# Patient Record
Sex: Male | Born: 1955 | Race: Black or African American | Hispanic: No | Marital: Single | Smoking: Current every day smoker
Health system: Southern US, Community
[De-identification: ages and names within clinical notes are randomized; demographics above are authoritative.]

## PROBLEM LIST (undated history)

## (undated) DIAGNOSIS — G8929 Other chronic pain: Secondary | ICD-10-CM

## (undated) HISTORY — PX: TOE AMPUTATION: SHX809

## (undated) HISTORY — PX: LEG AMPUTATION BELOW KNEE: SHX694

---

## 2003-01-05 ENCOUNTER — Emergency Department (HOSPITAL_COMMUNITY): Admission: EM | Admit: 2003-01-05 | Discharge: 2003-01-05 | Payer: Self-pay | Admitting: Emergency Medicine

## 2003-08-06 ENCOUNTER — Emergency Department (HOSPITAL_COMMUNITY): Admission: AD | Admit: 2003-08-06 | Discharge: 2003-08-06 | Payer: Self-pay | Admitting: Family Medicine

## 2003-08-10 ENCOUNTER — Emergency Department (HOSPITAL_COMMUNITY): Admission: AD | Admit: 2003-08-10 | Discharge: 2003-08-10 | Payer: Self-pay | Admitting: Family Medicine

## 2003-10-10 ENCOUNTER — Emergency Department (HOSPITAL_COMMUNITY): Admission: AD | Admit: 2003-10-10 | Discharge: 2003-10-10 | Payer: Self-pay | Admitting: Family Medicine

## 2004-06-04 ENCOUNTER — Emergency Department (HOSPITAL_COMMUNITY): Admission: EM | Admit: 2004-06-04 | Discharge: 2004-06-04 | Payer: Self-pay | Admitting: Family Medicine

## 2004-06-11 ENCOUNTER — Emergency Department (HOSPITAL_COMMUNITY): Admission: EM | Admit: 2004-06-11 | Discharge: 2004-06-11 | Payer: Self-pay | Admitting: Family Medicine

## 2005-01-04 IMAGING — CR DG CHEST 2V
1 series · 1 of 1 positions shown · non-contrast
Comparison: none

CLINICAL DATA: Cough. 
 PA AND LATERAL CHEST  
 No comparison.  The cardiomediastinal contours are normal.  There is mild interstitial prominence which appears chronic.  Pulmonary vascularity is normal, and there is no confluent air space opacity or pleural effusion.  The osseous structures appear unremarkable.
 IMPRESSION
 Probable chronic mild interstitial lung disease.  No acute chest findings demonstrated.

[view not recorded]
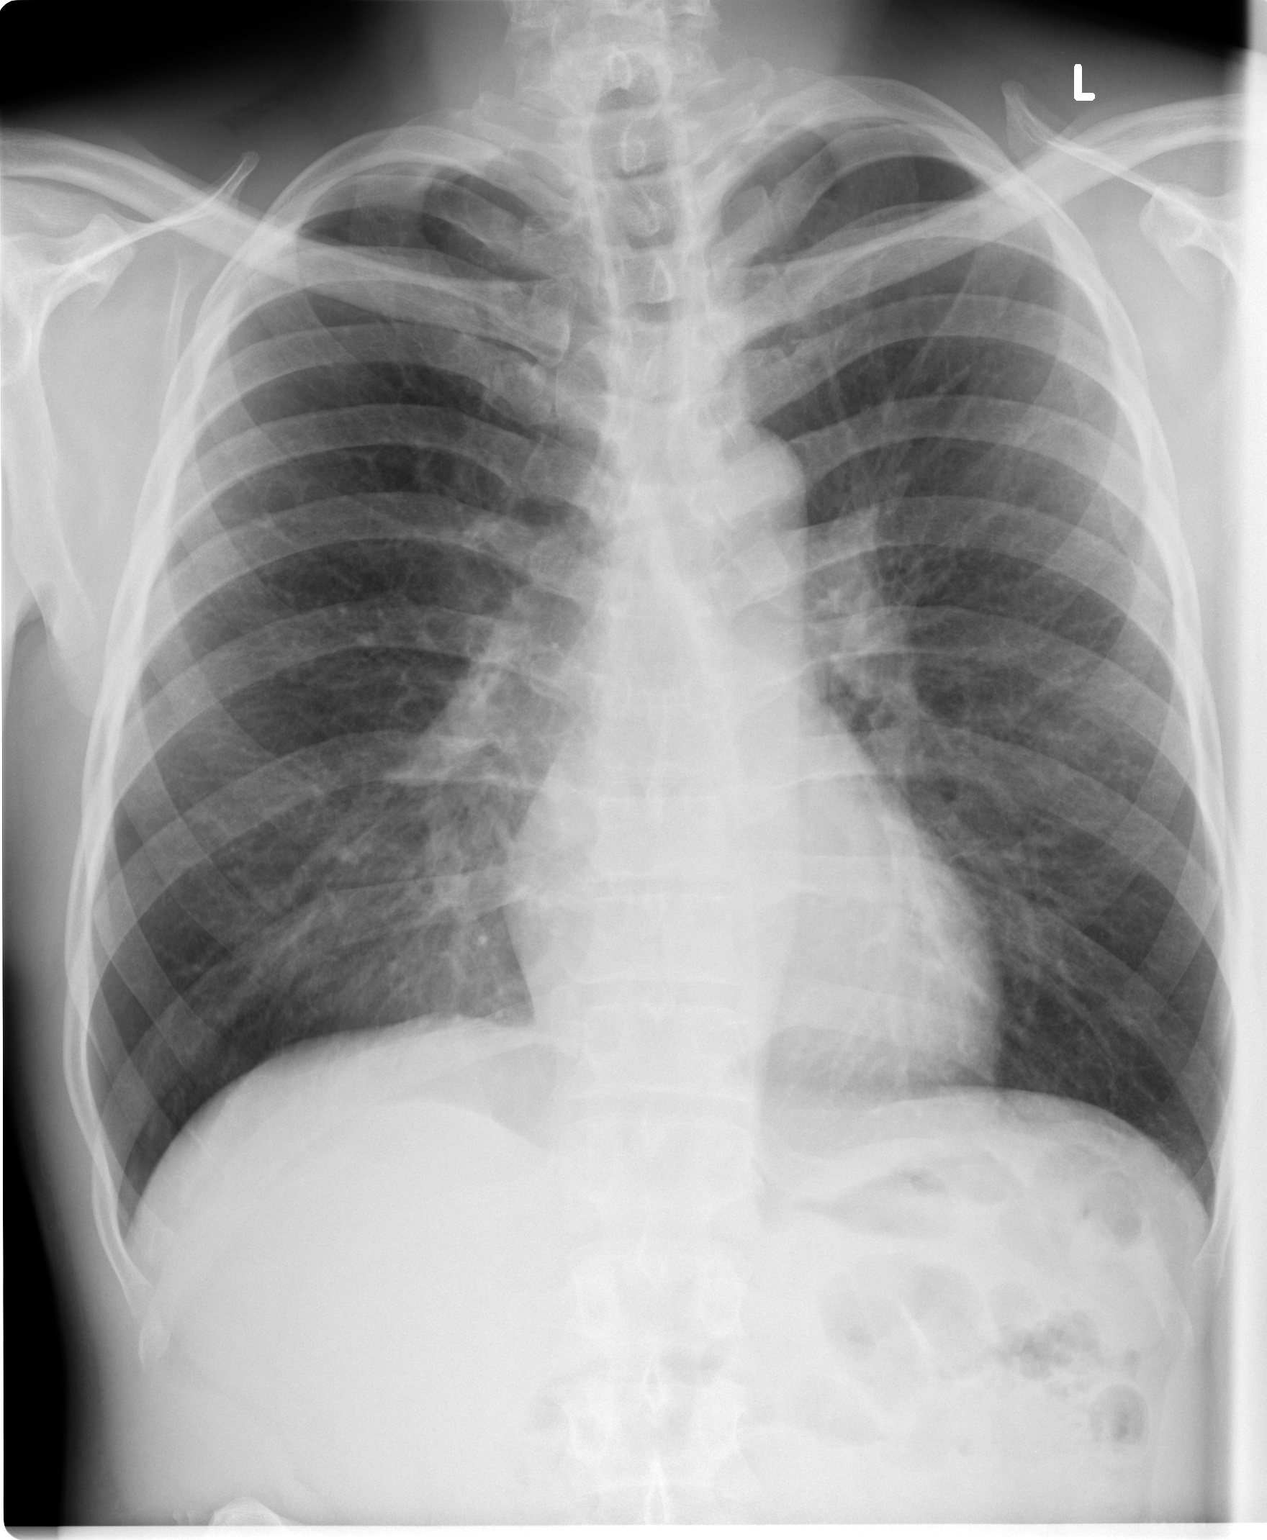

[1 of 1 positions shown; findings below may reference images not displayed]

## 2006-03-11 ENCOUNTER — Emergency Department (HOSPITAL_COMMUNITY): Admission: EM | Admit: 2006-03-11 | Discharge: 2006-03-11 | Payer: Self-pay | Admitting: Family Medicine

## 2007-04-18 ENCOUNTER — Emergency Department (HOSPITAL_COMMUNITY): Admission: EM | Admit: 2007-04-18 | Discharge: 2007-04-18 | Payer: Self-pay | Admitting: Emergency Medicine

## 2007-11-05 ENCOUNTER — Emergency Department (HOSPITAL_COMMUNITY): Admission: EM | Admit: 2007-11-05 | Discharge: 2007-11-05 | Payer: Self-pay | Admitting: Emergency Medicine

## 2007-11-07 ENCOUNTER — Emergency Department (HOSPITAL_COMMUNITY): Admission: EM | Admit: 2007-11-07 | Discharge: 2007-11-07 | Payer: Self-pay | Admitting: Emergency Medicine

## 2007-11-20 ENCOUNTER — Emergency Department (HOSPITAL_COMMUNITY): Admission: EM | Admit: 2007-11-20 | Discharge: 2007-11-20 | Payer: Self-pay | Admitting: Emergency Medicine

## 2008-06-17 ENCOUNTER — Emergency Department (HOSPITAL_COMMUNITY): Admission: EM | Admit: 2008-06-17 | Discharge: 2008-06-17 | Payer: Self-pay | Admitting: Family Medicine

## 2009-03-04 ENCOUNTER — Emergency Department (HOSPITAL_COMMUNITY): Admission: EM | Admit: 2009-03-04 | Discharge: 2009-03-04 | Payer: Self-pay | Admitting: Emergency Medicine

## 2009-11-16 IMAGING — CR DG KNEE COMPLETE 4+V*L*
4 series · 4 of 4 positions shown · non-contrast
Comparison: None

CLINICAL DATA: Leg pain.  Fall.  Axial none available

LEFT KNEE - COMPLETE 4+ VIEW

[view not recorded (1 of 4)]
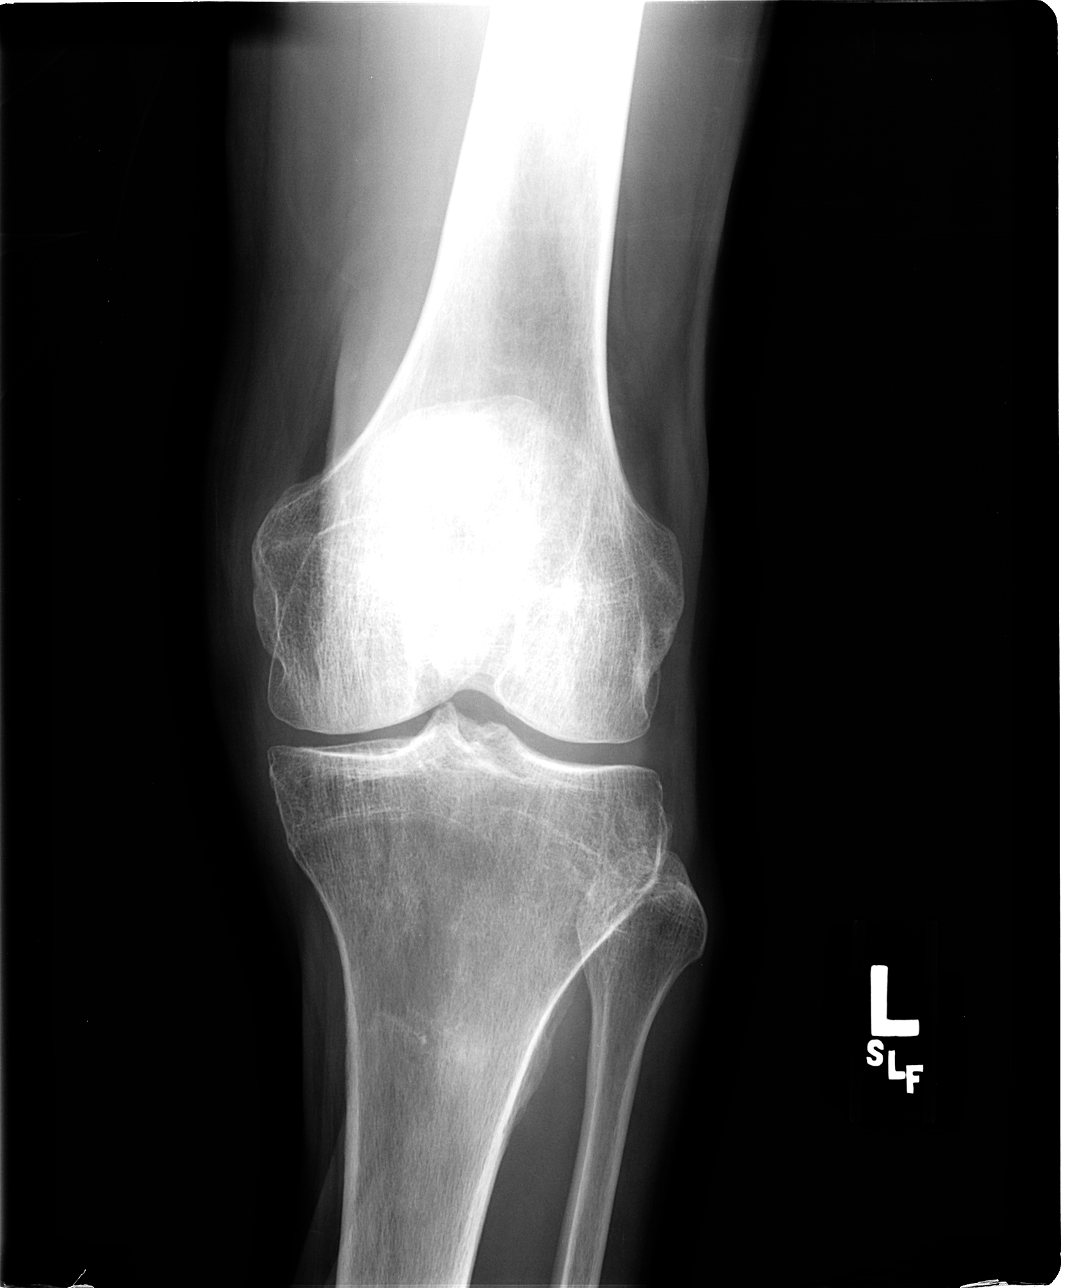

[view not recorded (2 of 4)]
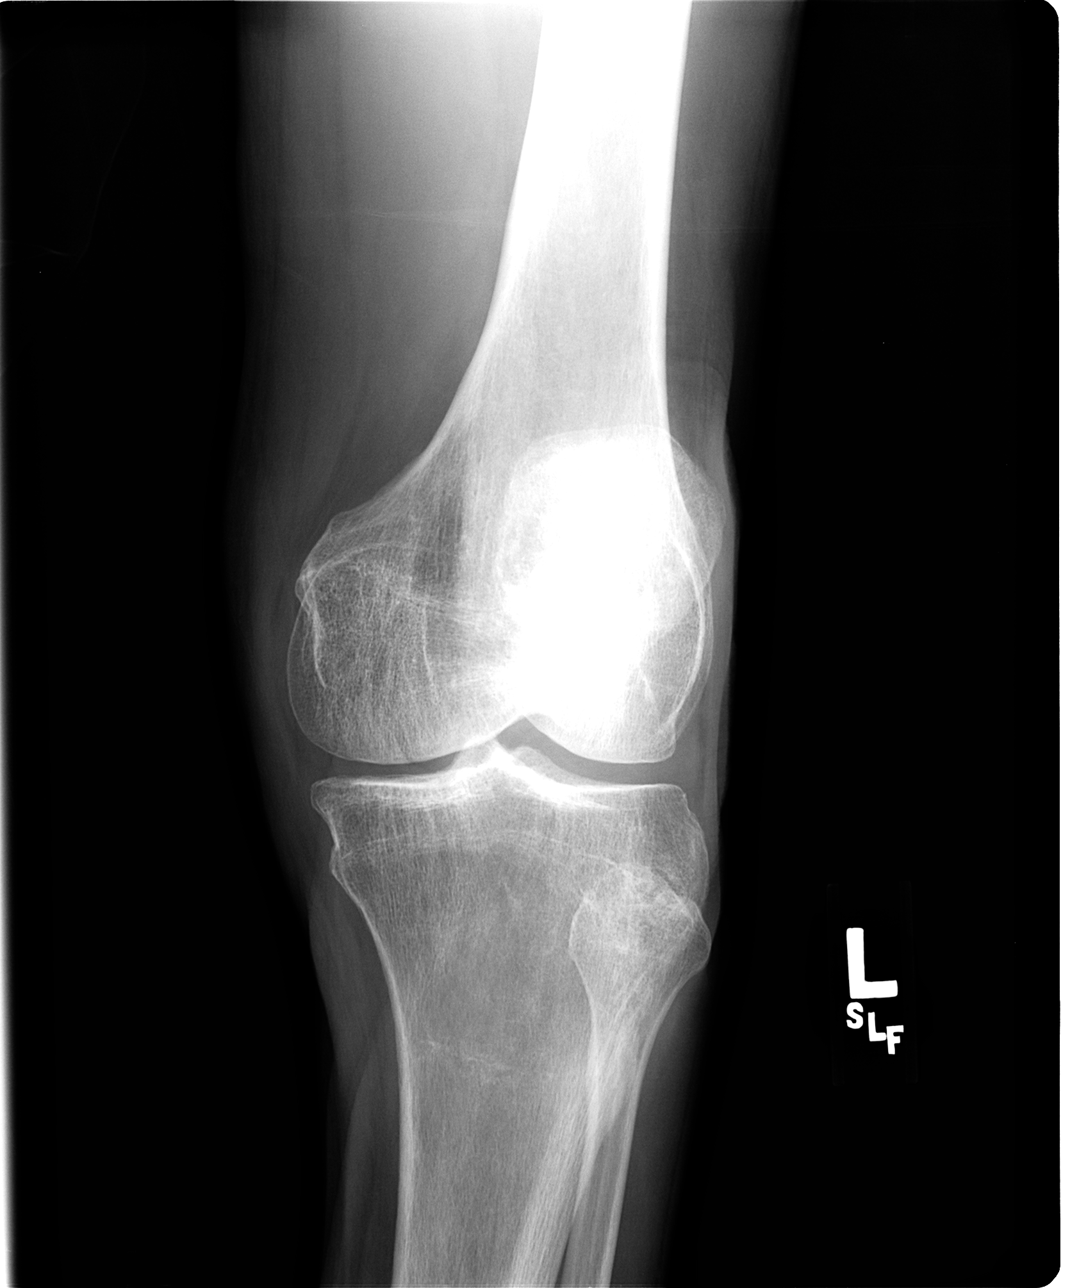

[view not recorded (3 of 4)]
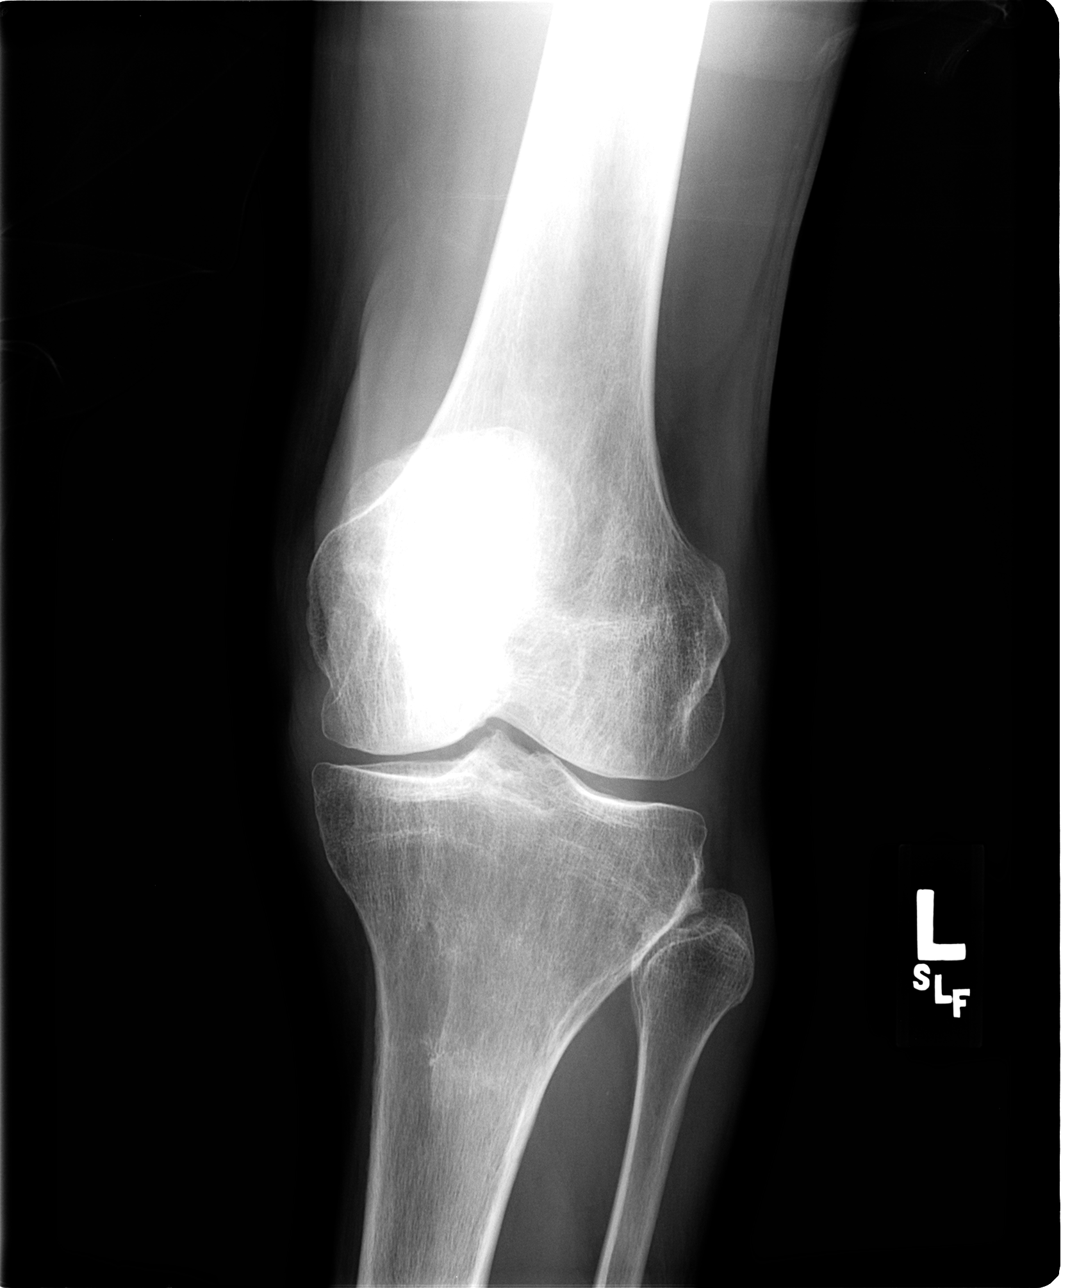

[view not recorded (4 of 4)]
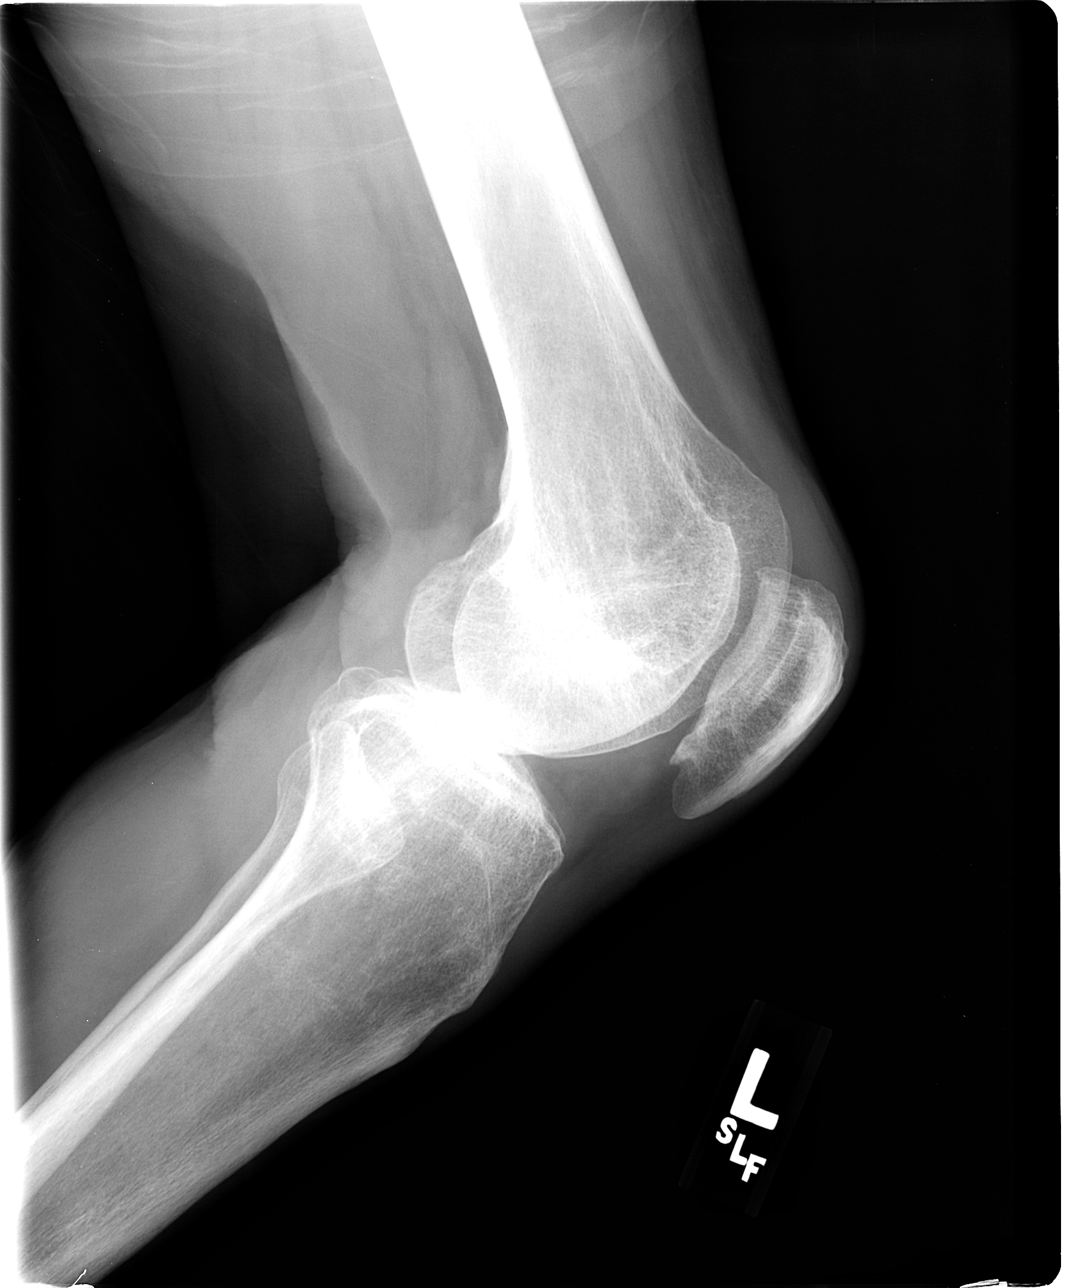

[4 of 4 positions shown; findings below may reference images not displayed]

FINDINGS: Osteopenia.  Mild medial joint space narrowing.  No
fracture is identified. Mild patellofemoral osteoarthritis.  No
radiographic evidence of effusion.
IMPRESSION: Osteopenia.  No fracture.

## 2010-02-14 ENCOUNTER — Emergency Department (HOSPITAL_COMMUNITY): Admission: EM | Admit: 2010-02-14 | Discharge: 2010-02-14 | Payer: Self-pay | Admitting: Family Medicine

## 2010-03-25 ENCOUNTER — Emergency Department (HOSPITAL_COMMUNITY): Admission: EM | Admit: 2010-03-25 | Discharge: 2010-03-25 | Payer: Self-pay | Admitting: Emergency Medicine

## 2010-04-30 ENCOUNTER — Emergency Department (HOSPITAL_COMMUNITY)
Admission: EM | Admit: 2010-04-30 | Discharge: 2010-04-30 | Payer: Self-pay | Source: Home / Self Care | Admitting: Emergency Medicine

## 2010-04-30 ENCOUNTER — Emergency Department (HOSPITAL_COMMUNITY): Admission: EM | Admit: 2010-04-30 | Discharge: 2010-04-30 | Payer: Self-pay | Admitting: Family Medicine

## 2011-05-07 LAB — POCT I-STAT CREATININE
Creatinine, Ser: 1
Operator id: 277751

## 2011-05-07 LAB — I-STAT 8, (EC8 V) (CONVERTED LAB)
Acid-base deficit: 1
BUN: 13
Bicarbonate: 23.5
Chloride: 106
Glucose, Bld: 100 — ABNORMAL HIGH
HCT: 51
Hemoglobin: 17.3 — ABNORMAL HIGH
Operator id: 277751
Potassium: 3.2 — ABNORMAL LOW
Sodium: 138
TCO2: 25
pCO2, Ven: 36.9 — ABNORMAL LOW
pH, Ven: 7.412 — ABNORMAL HIGH

## 2011-05-07 LAB — POCT CARDIAC MARKERS
CKMB, poc: 1.6
CKMB, poc: 2
Myoglobin, poc: 485
Myoglobin, poc: >500
Operator id: 151321
Operator id: 151321
Troponin i, poc: <0.05
Troponin i, poc: <0.05

## 2011-05-07 LAB — RAPID URINE DRUG SCREEN, HOSP PERFORMED
Amphetamines: NOT DETECTED
Barbiturates: NOT DETECTED
Benzodiazepines: NOT DETECTED
Cocaine: POSITIVE — AB
Opiates: NOT DETECTED
Tetrahydrocannabinol: POSITIVE — AB

## 2011-05-07 LAB — ETHANOL: Alcohol, Ethyl (B): <5

## 2013-02-26 ENCOUNTER — Emergency Department (HOSPITAL_COMMUNITY)
Admission: EM | Admit: 2013-02-26 | Discharge: 2013-02-26 | Disposition: A | Discharge status: Home / Self Care | Payer: Self-pay | Attending: Emergency Medicine | Admitting: Emergency Medicine

## 2013-02-26 ENCOUNTER — Encounter (HOSPITAL_COMMUNITY): Payer: Self-pay | Admitting: Emergency Medicine

## 2013-02-26 DIAGNOSIS — X58XXXA Exposure to other specified factors, initial encounter: Secondary | ICD-10-CM | POA: Insufficient documentation

## 2013-02-26 DIAGNOSIS — S88119A Complete traumatic amputation at level between knee and ankle, unspecified lower leg, initial encounter: Secondary | ICD-10-CM | POA: Insufficient documentation

## 2013-02-26 DIAGNOSIS — T8789 Other complications of amputation stump: Secondary | ICD-10-CM

## 2013-02-26 DIAGNOSIS — Y929 Unspecified place or not applicable: Secondary | ICD-10-CM | POA: Insufficient documentation

## 2013-02-26 DIAGNOSIS — T879 Unspecified complications of amputation stump: Secondary | ICD-10-CM | POA: Insufficient documentation

## 2013-02-26 DIAGNOSIS — Z89512 Acquired absence of left leg below knee: Secondary | ICD-10-CM

## 2013-02-26 DIAGNOSIS — Y939 Activity, unspecified: Secondary | ICD-10-CM | POA: Insufficient documentation

## 2013-02-26 MED ORDER — HYDROCODONE-ACETAMINOPHEN 5-325 MG PO TABS: 1.0000 | ORAL_TABLET | Freq: Four times a day (QID) | ORAL | Status: DC | PRN

## 2013-02-26 NOTE — ED Provider Notes (Signed)
Medical screening examination/treatment/procedure(s) were performed by non-physician practitioner and as supervising physician I was immediately available for consultation/collaboration.   Glynn Octave, MD 02/26/13 [phone]

## 2013-02-26 NOTE — ED Notes (Signed)
Pt c/o left leg pain at bottom of below the knee amputation.

## 2013-02-26 NOTE — ED Provider Notes (Signed)
  CSN: [id number]     Arrival date & time 02/26/13  1204 History     First MD Initiated Contact with Patient 02/26/13 1207     Chief Complaint  Patient presents with  . Leg Pain   (Consider location/radiation/quality/duration/timing/severity/associated sxs/prior Treatment) HPI Comments: Patient with a history of left BKA presents with pain of the distal part of his stump.  He reports that he does have chronic pain, but that the pain has worsened over the past 3-4 days.  He does wear a prosthesis.  He reports that the pain is worse with walking.  He denies any injury of the area.  He states that he has taken Ibuprofen for the pain without relief.  He denies any erythema, drainage, or swelling of the area.  Denies fever or chills.  He reports that he does have a PCP in [place], but does not have a PCP in [place].    The history is provided by the patient.    History reviewed. No pertinent past medical history. Past Surgical History  Procedure Laterality Date  . Leg amputation below knee     History reviewed. No pertinent family history. History  Substance Use Topics  . Smoking status: Not on file  . Smokeless tobacco: Not on file  . Alcohol Use: Not on file    Review of Systems  Constitutional: Negative for fever and chills.  Musculoskeletal:       Left stump pain  All other systems reviewed and are negative.    Allergies  Review of patient's allergies indicates not on file.  Home Medications  No current outpatient prescriptions on file. BP 122/82  Pulse 98  Temp(Src) 98.5 F (36.9 C) (Oral)  Resp 16  SpO2 98% Physical Exam  Nursing note and vitals reviewed. Constitutional: He appears well-developed and well-nourished. No distress.  HENT:  Head: Normocephalic and atraumatic.  Cardiovascular: Normal rate, regular rhythm and normal heart sounds.   Pulses:      Dorsalis pedis pulses are 2+ on the right side.  Pulmonary/Chest: Effort normal and breath sounds normal.   Musculoskeletal:  Prior amputation of the right 1st, 4th, and 5th toes Left leg stump with no drainage, erythema, or swelling.  Skin intact.  Neurological: He is alert.  Skin: Skin is warm and dry. He is not diaphoretic. No erythema.  Psychiatric: He has a normal mood and affect.    ED Course   Procedures (including critical care time)  Labs Reviewed - No data to display No results found. No diagnosis found.  MDM  Patient presenting with chronic pain of his left stump.  No signs of infection at this time.  Patient is afebrile.  Patient given short course of pain medication and instructed to follow up with PCP.  Pascal Lux [place], PA-C 02/26/13 1556

## 2013-05-01 ENCOUNTER — Encounter (HOSPITAL_COMMUNITY): Payer: Self-pay | Admitting: Nurse Practitioner

## 2013-05-01 ENCOUNTER — Emergency Department (HOSPITAL_COMMUNITY)
Admission: EM | Admit: 2013-05-01 | Discharge: 2013-05-01 | Disposition: A | Discharge status: Home / Self Care | Payer: No Typology Code available for payment source | Attending: Emergency Medicine | Admitting: Emergency Medicine

## 2013-05-01 DIAGNOSIS — M545 Low back pain: Secondary | ICD-10-CM

## 2013-05-01 DIAGNOSIS — Y92410 Unspecified street and highway as the place of occurrence of the external cause: Secondary | ICD-10-CM | POA: Insufficient documentation

## 2013-05-01 DIAGNOSIS — IMO0002 Reserved for concepts with insufficient information to code with codable children: Secondary | ICD-10-CM | POA: Insufficient documentation

## 2013-05-01 DIAGNOSIS — Y939 Activity, unspecified: Secondary | ICD-10-CM | POA: Insufficient documentation

## 2013-05-01 DIAGNOSIS — G8929 Other chronic pain: Secondary | ICD-10-CM | POA: Insufficient documentation

## 2013-05-01 HISTORY — DX: Other chronic pain: G89.29

## 2013-05-01 MED ORDER — OXYCODONE-ACETAMINOPHEN 5-325 MG PO TABS
1.0000 | ORAL_TABLET | Freq: Once | ORAL | Status: AC
  Administered 2013-05-01: 1 via ORAL
  Filled 2013-05-01: qty 1

## 2013-05-01 MED ORDER — CYCLOBENZAPRINE HCL 10 MG PO TABS
10.0000 mg | ORAL_TABLET | Freq: Once | ORAL | Status: AC
  Administered 2013-05-01: 10 mg via ORAL
  Filled 2013-05-01: qty 1

## 2013-05-01 MED ORDER — CYCLOBENZAPRINE HCL 10 MG PO TABS: 10.0000 mg | ORAL_TABLET | Freq: Two times a day (BID) | ORAL | Status: DC | PRN

## 2013-05-01 MED ORDER — OXYCODONE-ACETAMINOPHEN 5-325 MG PO TABS: 1.0000 | ORAL_TABLET | Freq: Four times a day (QID) | ORAL | Status: DC | PRN
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
## 2013-05-01 NOTE — ED Notes (Signed)
Received report from off going RN and introduced self to the pt.  No new needs at this time but will continue to monitor  

## 2013-05-01 NOTE — ED Notes (Signed)
Patient with left sided neck and back pain radiating down into left leg

## 2013-05-01 NOTE — Progress Notes (Signed)
Orthopedic Tech Progress Note Patient Details:  Jerry Friedman 1956-07-27 [id number]  Patient ID: [name], male   DOB: [birth year], 57 y.o.   MRN: [id number] Pt unable to use crutches; rn and Dr. Jeraldine Loots notified  Okey Dupre, Raney Koeppen 05/01/2013, 9:29 PM

## 2013-05-01 NOTE — ED Notes (Signed)
Family at bedside. 
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
## 2013-06-05 ENCOUNTER — Ambulatory Visit: Payer: Self-pay

## 2015-05-18 ENCOUNTER — Emergency Department (INDEPENDENT_AMBULATORY_CARE_PROVIDER_SITE_OTHER)
Admission: EM | Admit: 2015-05-18 | Discharge: 2015-05-18 | Disposition: A | Discharge status: Home / Self Care | Payer: Medicare Other | Source: Home / Self Care | Attending: Family Medicine | Admitting: Family Medicine

## 2015-05-18 ENCOUNTER — Encounter (HOSPITAL_COMMUNITY): Payer: Self-pay | Admitting: *Deleted

## 2015-05-18 DIAGNOSIS — G546 Phantom limb syndrome with pain: Secondary | ICD-10-CM | POA: Diagnosis not present

## 2015-05-18 MED ORDER — DICLOFENAC SODIUM 1 % TD GEL: 4.0000 g | Freq: Four times a day (QID) | TRANSDERMAL | Status: DC

## 2015-05-18 NOTE — Discharge Instructions (Signed)
See your doctor as needed

## 2015-05-18 NOTE — ED Notes (Signed)
Pt has  An  Amputation    To  l    Lower    Leg  He  Reports     Chronic   Pain         he  denys  Any  Recent  Injury

## 2015-05-18 NOTE — ED Provider Notes (Signed)
CSN: [id number]     Arrival date & time 05/18/15  1300 History   First MD Initiated Contact with Patient 05/18/15 1307     Chief Complaint  Patient presents with  . Leg Pain   (Consider location/radiation/quality/duration/timing/severity/associated sxs/prior Treatment) Patient is a 59 y.o. male presenting with leg pain. The history is provided by the patient.  Leg Pain Location:  Knee Time since incident:  3 weeks Injury: no   Knee location:  L knee (stump pain from bka.) Pain details:    Severity:  Mild   Onset quality:  Gradual   Progression:  Unchanged Chronicity:  Chronic Dislocation: no     Past Medical History  Diagnosis Date  . Chronic pain    Past Surgical History  Procedure Laterality Date  . Leg amputation below knee     No family history on file. Social History  Substance Use Topics  . Smoking status: Never Smoker   . Smokeless tobacco: None  . Alcohol Use: None    Review of Systems  Musculoskeletal: Positive for gait problem. Negative for joint swelling.  Skin: Positive for wound. Negative for color change and rash.  All other systems reviewed and are negative.   Allergies  Review of patient's allergies indicates no known allergies.  Home Medications   Prior to Admission medications   Medication Sig Start Date End Date Taking? Authorizing Provider  cyclobenzaprine (FLEXERIL) 10 MG tablet Take 1 tablet (10 mg total) by mouth 2 (two) times daily as needed for muscle spasms. 05/01/13   Dorna LeitzAlex Nickle, MD  diclofenac sodium (VOLTAREN) 1 % GEL Apply 4 g topically 4 (four) times daily. 05/18/15   Linna HoffJames D Anjelika Ausburn, MD  ibuprofen (ADVIL,MOTRIN) 200 MG tablet Take 200 mg by mouth daily as needed for pain.    Historical Provider, MD  Menthol-Methyl Salicylate (MUSCLE RUB) 10-15 % CREA Apply 1 application topically 2 (two) times daily as needed (pain).    Historical Provider, MD  oxyCODONE-acetaminophen (PERCOCET/ROXICET) 5-325 MG per tablet Take 1 tablet by mouth  every 6 (six) hours as needed for pain. 05/01/13   Dorna LeitzAlex Nickle, MD   Meds Ordered and Administered this Visit  Medications - No data to display  BP 143/82 mmHg  Pulse 89  Temp(Src) 97.5 F (36.4 C) (Oral)  SpO2 95% No data found.   Physical Exam  Constitutional: He is oriented to person, place, and time. He appears well-developed and well-nourished. No distress.  Musculoskeletal: He exhibits tenderness.  Knee stump callous,no infection, no erythema or drainage.  Neurological: He is alert and oriented to person, place, and time.  Skin: Skin is warm and dry.  Nursing note and vitals reviewed.   ED Course  Procedures (including critical care time)  Labs Review Labs Reviewed - No data to display  Imaging Review No results found.   Visual Acuity Review  Right Eye Distance:   Left Eye Distance:   Bilateral Distance:    Right Eye Near:   Left Eye Near:    Bilateral Near:         MDM   1. Pain, phantom limb (HCC)    rx for voltaren gel   Linna HoffJames D Reshard Guillet, MD 05/18/15 1339

## 2015-05-20 ENCOUNTER — Emergency Department (HOSPITAL_COMMUNITY)
Admission: EM | Admit: 2015-05-20 | Discharge: 2015-05-20 | Disposition: A | Discharge status: Home / Self Care | Payer: Medicare Other | Attending: Emergency Medicine | Admitting: Emergency Medicine

## 2015-05-20 ENCOUNTER — Encounter (HOSPITAL_COMMUNITY): Payer: Self-pay | Admitting: Vascular Surgery

## 2015-05-20 DIAGNOSIS — Z720 Tobacco use: Secondary | ICD-10-CM | POA: Insufficient documentation

## 2015-05-20 DIAGNOSIS — M79662 Pain in left lower leg: Secondary | ICD-10-CM | POA: Insufficient documentation

## 2015-05-20 DIAGNOSIS — M79605 Pain in left leg: Secondary | ICD-10-CM | POA: Diagnosis not present

## 2015-05-20 DIAGNOSIS — G8929 Other chronic pain: Secondary | ICD-10-CM | POA: Diagnosis not present

## 2015-05-20 DIAGNOSIS — Z89512 Acquired absence of left leg below knee: Secondary | ICD-10-CM

## 2015-05-20 DIAGNOSIS — Z791 Long term (current) use of non-steroidal anti-inflammatories (NSAID): Secondary | ICD-10-CM | POA: Diagnosis not present

## 2015-05-20 DIAGNOSIS — Z89612 Acquired absence of left leg above knee: Secondary | ICD-10-CM | POA: Insufficient documentation

## 2015-05-20 NOTE — ED Notes (Signed)
Declined W/C at D/C and was escorted to lobby by RN. 
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
## 2015-05-20 NOTE — Discharge Instructions (Signed)
Continue using ibuprofen and Tylenol as prescribed over-the-counter for pain relief as needed. I recommend trying to rest your left leg throughout the day. Continue using your crutches as needed. Follow-up with your primary care provider for chronic pain management. Return to the emergency department if symptoms worsen or new onset fever, numbness, tingling, weakness.   Emergency Department Resource Guide 1) Find a Doctor and Pay Out of Pocket Although you won't have to find out who is covered by your insurance plan, it is a good idea to ask around and get recommendations. You will then need to call the office and see if the doctor you have chosen will accept you as a new patient and what types of options they offer for patients who are self-pay. Some doctors offer discounts or will set up payment plans for their patients who do not have insurance, but you will need to ask so you aren't surprised when you get to your appointment.  2) Contact Your Local Health Department Not all health departments have doctors that can see patients for sick visits, but many do, so it is worth a call to see if yours does. If you don't know where your local health department is, you can check in your phone book. The CDC also has a tool to help you locate your state's health department, and many state websites also have listings of all of their local health departments.  3) Find a Walk-in Clinic If your illness is not likely to be very severe or complicated, you may want to try a walk in clinic. These are popping up all over the country in pharmacies, drugstores, and shopping centers. They're usually staffed by nurse practitioners or physician assistants that have been trained to treat common illnesses and complaints. They're usually fairly quick and inexpensive. However, if you have serious medical issues or chronic medical problems, these are probably not your best option.  No Primary Care Doctor: - Call Health Connect  at  [phone] - they can help you locate a primary care doctor that  accepts your insurance, provides certain services, etc. - Physician Referral Service- [phone]  Chronic Pain Problems: Organization         Address  Phone   Notes  [facility]  [phone] Patients need to be referred by their primary care doctor.   Medication Assistance: Organization         Address  Phone   Notes  [facility] Medication [facility] [address] [place], [place] [address] [phone]) [phone] --Must be a resident of [facility] [place] -- Must have NO insurance coverage whatsoever (no Medicaid/ Medicare, etc.) -- The pt. MUST have a primary care doctor that directs their care regularly and follows them in the community   MedAssist  [phone]   [employer]  [phone]    Agencies that provide inexpensive medical care: Organization         Address  Phone   Notes  [facility]  [phone]   [facility]    [phone]   [facility] [address] [place], [place] [address] [phone]   [facility] [address], [place] [phone]   Planned Parenthood    [phone]) [phone]   [facility]    [phone]   [facility]  [address], [place] Phone:  [phone](336) [phone], Fax:  [phone]  Hours of Operation:  9 am - 6 pm, M-F.  Also accepts Medicaid/Medicare and self-pay.  [facility]  [address], [place] Phone: [phone](336) [phone], Fax: [phone]) [phone]. Hours of Operation:  8:30 am - 5:30 pm, M-F.  Also accepts Medicaid and self-pay.  [facility] [place] [address], [place] Phone: [phone]) [phone]   [facility] [address], [place], [place] [phone], Ext. [phone] Mondays & Thursdays: 7-9 AM.  First 15 patients are seen on a first come, first serve basis.     Medicaid-accepting [facility] Providers:  Organization         Address  Phone   Notes  [facility] [address], [place] [phone]) [phone] Also accepts self-pay patients.  [facility] [address], [place]  [phone]) [phone]   [facility] [address], [place] [phone]   [facility] [address] [place] [address], [place] [phone]) [phone]   Renaye RakersVeita Bland [address], [place]   [phone] Only accepts [place] Access [place] patients after they have their name applied to their card.   Self-Pay (no insurance) in [facility] [place]:  Organization         Address  Phone   Notes  Sickle Cell Patients, [facility] [address], [place] [phone]) [phone]   [facility] [address], [place] [phone]   [facility] [place]  [address], [place] [phone]   [facility]/Dr. Osei-Bonsu  [address] [place] [address], [place] or [phone], [place] [phone] Phone number for both [place] and [place] locations is the same.  [facility] [address], [place] [phone]   [facility] [place] [address] [place] [address], [place] or [address] [phone]) [phone]   [facility] [address], [place] [phone], phone; [phone]) [phone], fax Sees patients 1st and 3rd Saturday of every month.  Must not qualify for public or private insurance (i.e. Medicaid, Medicare, [place] Health Choice, Veterans' Benefits)  Household income should be no more than 200% of the poverty level The clinic cannot treat you if you are pregnant or think you are pregnant  Sexually transmitted diseases are not treated at the clinic.    Dental Care: Organization         Address  Phone  Notes  [facility]  Department of [facility] [address], [place] [phone] Accepts children up to age 61 who are enrolled in [place]icaid or [place] Health Choice; pregnant women with a Medicaid card; and children who have applied for Medicaid or [place] Health Choice, but were declined, whose parents can pay a reduced fee at time of service.  [facility] Department of [facility] [place]  [address], [place] [phone] Accepts children up to age 61 who are enrolled in [place]icaid or [place] Health Choice; pregnant women with a Medicaid card; and children who have applied for Medicaid or [place] Health Choice, but were declined, whose parents can pay a reduced fee at time of service.  [facility]  [address], [place] [phone] Patients are seen by appointment only. Walk-ins are not accepted. [facility] will  see patients 85 years of age and older. Monday - Tuesday (8am-5pm) Most Wednesdays (8:30-5pm) $30 per visit, cash only  [facility] Access PROGRAM  [address], [facility] [phone] Patients are seen by appointment only. Walk-ins are not accepted. [place] will see patients 66 years of age and older. One Wednesday Evening (Monthly: Volunteer Based).  $30 per visit, cash only  [place]  [phone] for adults; Children under age 39, call Graduate Pediatric Dentistry at [phone]. Children aged 45-14, please call [phone] to request a pediatric application.  Dental services are provided in all areas of dental care including fillings, crowns and bridges, complete and partial dentures, implants, gum treatment, root canals, and extractions. Preventive care is also provided. Treatment is provided to both adults and children. Patients are selected via a lottery and there is often a waiting list.   [facility] [address] [place]  [phone]  [email or web address]   [facility] [address] [place], [place] [phone], Ext. [phone] Second and Fourth Thursday of each month, opens at 6:30 AM; Clinic ends at 9 AM.  Patients are seen on a first-come first-served basis, and a limited number are seen during each clinic.   [facility]  [address] Hillard Danker [place], [place] [phone]   Eligibility Requirements You must have lived in [place], [place], or [place] counties for at least the last three months.   You cannot be eligible for state or federal sponsored [employer], including [employer], [place], or [employer].   You generally cannot be eligible for healthcare insurance through your employer.    How to apply: Eligibility screenings are held every Tuesday and Wednesday afternoon from 1:00 pm until 4:00 pm. You do not need an appointment for the interview!  [facility] [address], [place], [place]   [place]  [place] Department  [place]  [phone]    Behavioral Health Resources in the Community: Intensive Outpatient Programs Organization         Address  Phone  Notes  [place]. [address] [place], [place] [phone]   [facility] [address] [place], [place]   ADS: Alcohol & Drug Svcs [address], [place], [place]   [place] [address],  [place], [place] or [phone]   Substance Abuse Resources Organization         Address  Phone  Notes  Alcohol and Drug Services  [phone]   [place]  [phone]   [facility] [place]   Chinita Pester  [phone]   Residential & Outpatient Substance Abuse Program  [phone]   Psychological Services Organization          Address  Phone  Notes  [facility] [place]  [place]  [phone]   [place] [address], [place] or [phone]    Mobile Crisis Teams Organization         Address  Phone  Notes  [facility], Mobile Crisis Care Unit  [phone]   [facility]  [address] [place], [place]   [facility] [address] [place] [phone]    Self-Help/Support Groups Organization  Address  Phone             Notes  [facility]ental [facility]oc. of [place] - variety of support groups  [phone] Call for more information  Narcotics Anonymous (NA), [facility] [address], [place]  2 meetings at this location   Statisticianesidential Treatment Programs Organization         Address  Phone  Notes  [facility] [address],    [place]  [phone]   [facility]  [address], [place] [phone], [place], [place] [phone]   [facility] [address], [place] [phone] Admissions: 8am-3pm M-F  [facility] [address]    [place], [place] [phone]   [facility] [address], [place], [place] [phone]   [facility] [address]  [place], [place] [phone]   [facility] - Intensive Outpatient [address], [place], [place] [phone]   [facility] ([facility] [address]  [place], [place] [phone] or [phone]   [facility] (RT[facility]) [address] [place], [place] [phone] Accepts Medicaid  [facility] [address]  [place] [phone] Substance Abuse/Addiction Treatment   [facility] Behavioral Health Resources Organization         Address  Phone  Notes  [facility]  [phone]) [phone]   Angie FavaJulie Brannon, PhD [address], [address] [place], [place]   [phone]) [phone] or [phone]) [phone]   [facility]   [address] [place], [place] [phone](336) [phone]   [facility] [address], [place], [place] [phone]) [phone] Insurance/Medicaid/sponsorship through [facility]  Faith and Families [address]                                    [place], [place] [phone]) [phone] Therapy/tele-psych/case  [facility] [address]   [place], [place] [phone]) [phone]    Dr. Lolly MustacheArfeen  [phone]) [phone]   [facility]  [employer] [facility] 1) [address], [place] 2) [address], [place] 3)  [address], [place] [phone](336) [phone]  [phone]) [phone]   [facility] Child Abuse Hotline [phone]) [phone] or [phone](336) [phone] (After Hours)

## 2015-05-20 NOTE — ED Notes (Signed)
Pt reports to the ED for eval of left stump pain. He reports whenever he walks he has increased pain. He has a scabbed over area to his left stump. No erythema or swelling noted. Pt has hx of chronic pain in the stump. Also reports amputations to right foot and states it is hurting. No s/s of infection. He is not diabetic. Pt A&OX4, resp e/u, and skin warm and dry

## 2015-10-23 DIAGNOSIS — S88112S Complete traumatic amputation at level between knee and ankle, left lower leg, sequela: Secondary | ICD-10-CM | POA: Diagnosis not present

## 2015-10-23 DIAGNOSIS — G8921 Chronic pain due to trauma: Secondary | ICD-10-CM | POA: Diagnosis not present

## 2015-10-23 DIAGNOSIS — F17200 Nicotine dependence, unspecified, uncomplicated: Secondary | ICD-10-CM | POA: Diagnosis not present

## 2015-11-22 DIAGNOSIS — G8921 Chronic pain due to trauma: Secondary | ICD-10-CM | POA: Diagnosis not present

## 2015-11-22 DIAGNOSIS — F17200 Nicotine dependence, unspecified, uncomplicated: Secondary | ICD-10-CM | POA: Diagnosis not present

## 2015-11-22 DIAGNOSIS — S88112S Complete traumatic amputation at level between knee and ankle, left lower leg, sequela: Secondary | ICD-10-CM | POA: Diagnosis not present

## 2015-12-24 DIAGNOSIS — G8921 Chronic pain due to trauma: Secondary | ICD-10-CM | POA: Diagnosis not present

## 2015-12-24 DIAGNOSIS — F17200 Nicotine dependence, unspecified, uncomplicated: Secondary | ICD-10-CM | POA: Diagnosis not present

## 2015-12-24 DIAGNOSIS — S88112S Complete traumatic amputation at level between knee and ankle, left lower leg, sequela: Secondary | ICD-10-CM | POA: Diagnosis not present

## 2016-01-22 DIAGNOSIS — S88112S Complete traumatic amputation at level between knee and ankle, left lower leg, sequela: Secondary | ICD-10-CM | POA: Diagnosis not present

## 2016-01-22 DIAGNOSIS — F17200 Nicotine dependence, unspecified, uncomplicated: Secondary | ICD-10-CM | POA: Diagnosis not present

## 2016-01-22 DIAGNOSIS — G8921 Chronic pain due to trauma: Secondary | ICD-10-CM | POA: Diagnosis not present

## 2016-02-20 DIAGNOSIS — G8921 Chronic pain due to trauma: Secondary | ICD-10-CM | POA: Diagnosis not present

## 2016-02-20 DIAGNOSIS — Z0001 Encounter for general adult medical examination with abnormal findings: Secondary | ICD-10-CM | POA: Diagnosis not present

## 2016-02-20 DIAGNOSIS — S88112S Complete traumatic amputation at level between knee and ankle, left lower leg, sequela: Secondary | ICD-10-CM | POA: Diagnosis not present

## 2016-02-20 DIAGNOSIS — F17200 Nicotine dependence, unspecified, uncomplicated: Secondary | ICD-10-CM | POA: Diagnosis not present

## 2016-03-26 DIAGNOSIS — Z79891 Long term (current) use of opiate analgesic: Secondary | ICD-10-CM | POA: Diagnosis not present

## 2016-06-12 DIAGNOSIS — R52 Pain, unspecified: Secondary | ICD-10-CM | POA: Diagnosis not present

## 2016-06-12 DIAGNOSIS — S88112S Complete traumatic amputation at level between knee and ankle, left lower leg, sequela: Secondary | ICD-10-CM | POA: Diagnosis not present

## 2016-06-12 DIAGNOSIS — F17200 Nicotine dependence, unspecified, uncomplicated: Secondary | ICD-10-CM | POA: Diagnosis not present

## 2016-07-15 DIAGNOSIS — M545 Low back pain: Secondary | ICD-10-CM | POA: Diagnosis not present

## 2016-07-15 DIAGNOSIS — G8929 Other chronic pain: Secondary | ICD-10-CM | POA: Diagnosis not present

## 2016-07-15 DIAGNOSIS — R52 Pain, unspecified: Secondary | ICD-10-CM | POA: Diagnosis not present

## 2016-07-15 DIAGNOSIS — S88112S Complete traumatic amputation at level between knee and ankle, left lower leg, sequela: Secondary | ICD-10-CM | POA: Diagnosis not present

## 2016-08-17 DIAGNOSIS — F17200 Nicotine dependence, unspecified, uncomplicated: Secondary | ICD-10-CM | POA: Diagnosis not present

## 2016-08-17 DIAGNOSIS — Z0001 Encounter for general adult medical examination with abnormal findings: Secondary | ICD-10-CM | POA: Diagnosis not present

## 2016-08-17 DIAGNOSIS — G8921 Chronic pain due to trauma: Secondary | ICD-10-CM | POA: Diagnosis not present

## 2016-08-17 DIAGNOSIS — S88112S Complete traumatic amputation at level between knee and ankle, left lower leg, sequela: Secondary | ICD-10-CM | POA: Diagnosis not present

## 2016-09-29 DIAGNOSIS — G8921 Chronic pain due to trauma: Secondary | ICD-10-CM | POA: Diagnosis not present

## 2016-09-29 DIAGNOSIS — Z79891 Long term (current) use of opiate analgesic: Secondary | ICD-10-CM | POA: Diagnosis not present

## 2016-09-29 DIAGNOSIS — S88112S Complete traumatic amputation at level between knee and ankle, left lower leg, sequela: Secondary | ICD-10-CM | POA: Diagnosis not present

## 2016-09-29 DIAGNOSIS — Z0001 Encounter for general adult medical examination with abnormal findings: Secondary | ICD-10-CM | POA: Diagnosis not present

## 2016-09-29 DIAGNOSIS — F17200 Nicotine dependence, unspecified, uncomplicated: Secondary | ICD-10-CM | POA: Diagnosis not present

## 2017-01-20 DIAGNOSIS — Z79891 Long term (current) use of opiate analgesic: Secondary | ICD-10-CM | POA: Diagnosis not present

## 2017-02-02 DIAGNOSIS — Z79891 Long term (current) use of opiate analgesic: Secondary | ICD-10-CM | POA: Diagnosis not present

## 2017-06-21 DIAGNOSIS — Z79891 Long term (current) use of opiate analgesic: Secondary | ICD-10-CM | POA: Diagnosis not present

## 2017-09-26 ENCOUNTER — Ambulatory Visit (HOSPITAL_COMMUNITY)
Admission: EM | Admit: 2017-09-26 | Discharge: 2017-09-26 | Disposition: A | Discharge status: Home / Self Care | Payer: Medicare Other | Attending: Physician Assistant | Admitting: Physician Assistant

## 2017-09-26 ENCOUNTER — Other Ambulatory Visit: Payer: Self-pay

## 2017-09-26 ENCOUNTER — Encounter (HOSPITAL_COMMUNITY): Payer: Self-pay | Admitting: *Deleted

## 2017-09-26 DIAGNOSIS — T8789 Other complications of amputation stump: Secondary | ICD-10-CM | POA: Diagnosis not present

## 2017-09-26 DIAGNOSIS — M79605 Pain in left leg: Secondary | ICD-10-CM

## 2017-09-26 DIAGNOSIS — L84 Corns and callosities: Secondary | ICD-10-CM

## 2017-09-26 DIAGNOSIS — Z89512 Acquired absence of left leg below knee: Secondary | ICD-10-CM | POA: Diagnosis not present

## 2017-09-26 MED ORDER — OXYCODONE-ACETAMINOPHEN 10-325 MG PO TABS: 1.0000 | ORAL_TABLET | Freq: Three times a day (TID) | ORAL | 0 refills | Status: DC | PRN

## 2017-09-26 NOTE — Discharge Instructions (Signed)
[facility].  I am having our call center call you with regard to setting up a primary care appointment. You will need to start getting her medication from another primary care.

## 2017-09-26 NOTE — ED Triage Notes (Signed)
C/O callus on LLE stump and feet x approx 3 wks.  C/O significant pain despite trying several OTC meds.  "I need something to relieve this pain".  States his normal doctor is out of town.

## 2017-09-26 NOTE — ED Provider Notes (Signed)
09/26/2017 5:19 PM   DOB: 1956-06-06 / MRN: [id number]  SUBJECTIVE:  Jerry Friedman is a 62 y.o. male presenting for callus pain.  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  He has several calluses about the right foot plantar surface as well.  He is a former patient of Dr. Thea SilversmithMackenzie who has recently lost his license to practice medicine [place] secondary to prescribing large amounts of narcotics.   He is allergic to ibuprofen and naproxen.   He  has a past medical history of Chronic pain.    He  reports that he has been smoking cigarettes.  He has been smoking about 1.00 pack per day. he has never used smokeless tobacco. He reports that he does not drink alcohol or use drugs. He  has no sexual activity history on file. 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  His family history is not on file.  Review of Systems  Constitutional: Negative for chills, diaphoresis and fever.  Respiratory: Negative for cough, hemoptysis, sputum production, shortness of breath and wheezing.   Cardiovascular: Negative for chest pain, orthopnea and leg swelling.  Gastrointestinal: Negative for nausea.  Skin: Positive for rash. Negative for itching.  Neurological: Negative for dizziness.    OBJECTIVE:  BP (!) 144/86   Pulse 92   Temp 98.2 F (36.8 C)   Resp 20   SpO2 97%   Physical Exam  Constitutional: He appears well-developed. He is active and cooperative.  Non-toxic appearance.  Cardiovascular: Normal rate.  Pulmonary/Chest: Effort normal. No tachypnea.  Musculoskeletal:       Legs:      Feet:  Neurological: He is alert.  Skin: Skin is warm and dry. He is not diaphoretic. No pallor.  Vitals reviewed.   No results found for this or any previous visit (from the past 72 hour(s)).  No results found.  ASSESSMENT AND PLAN:  No orders of the defined  types were placed in this encounter.    Callus of foot - Calluses do appear quite painful.  He is a former patient of Dr. Ronne BinningMcKenzie who did recently lose his license in [place] for over prescribing narcotics.  I am having the [facility]   Call and set up a primary care appointment.  [facility] am [facility] so they may help him with these calluses.  He will most likely also need some help with his orthotic.  I am refilling his medication for 5 days.  I reche[facility] the patient in the [facility] database and it does appear that he has been using the medication as prescribed, and was not seeking medication from other providers.      The patient is advised to call or return to clinic if he does not see an improvement in symptoms, or to seek the care of the closest emergency department if he worsens with the above plan.   Deliah BostonMichael Lucilla Petrenko, MHS, PA-C 09/26/2017 5:19 PM    Ofilia Neaslark, Genie Mirabal L, PA-C 09/26/17 1721

## 2017-10-24 DIAGNOSIS — Z89512 Acquired absence of left leg below knee: Secondary | ICD-10-CM | POA: Diagnosis not present

## 2017-10-24 DIAGNOSIS — M7741 Metatarsalgia, right foot: Secondary | ICD-10-CM | POA: Diagnosis not present

## 2017-10-24 DIAGNOSIS — G8929 Other chronic pain: Secondary | ICD-10-CM | POA: Diagnosis not present

## 2017-10-24 DIAGNOSIS — M79671 Pain in right foot: Secondary | ICD-10-CM | POA: Diagnosis not present

## 2017-10-24 DIAGNOSIS — M25562 Pain in left knee: Secondary | ICD-10-CM | POA: Diagnosis not present

## 2017-12-11 ENCOUNTER — Ambulatory Visit (HOSPITAL_COMMUNITY)
Admission: EM | Admit: 2017-12-11 | Discharge: 2017-12-11 | Disposition: A | Discharge status: Home / Self Care | Payer: Medicare Other | Attending: Internal Medicine | Admitting: Internal Medicine

## 2017-12-11 ENCOUNTER — Encounter (HOSPITAL_COMMUNITY): Payer: Self-pay | Admitting: *Deleted

## 2017-12-11 ENCOUNTER — Other Ambulatory Visit: Payer: Self-pay

## 2017-12-11 DIAGNOSIS — T8789 Other complications of amputation stump: Secondary | ICD-10-CM

## 2017-12-11 DIAGNOSIS — M79609 Pain in unspecified limb: Secondary | ICD-10-CM

## 2017-12-11 MED ORDER — OXYCODONE-ACETAMINOPHEN 10-325 MG PO TABS: 1.0000 | ORAL_TABLET | Freq: Three times a day (TID) | ORAL | 0 refills | Status: AC | PRN

## 2017-12-11 NOTE — ED Triage Notes (Signed)
At the bottom of his rt amputated leg is hurting

## 2017-12-11 NOTE — ED Provider Notes (Signed)
[facility]    CSN: [id number] Arrival date & time: 12/11/17  1158     History   Chief Complaint Chief Complaint  Patient presents with  . Foot Pain    HPI Jerry Friedman is a 62 y.o. male.   Jerry Friedman presents with complaints of pain to left stump where he has a callous from use of his prosthetic leg. He also has pain to his right foot where he has multiple calloses to his toes and feet from walking. This is chronic in nature. He was a previous patient of Dr. Ronne Binning. He states he also sees a PCP in [place] for this, denies any local PCP. States he has seen podiatry in the past. States percocet is the only medication which treats his pain, he cannot tolerate NSAIDS and they do not help he states. Per review on PMP patient had previosly been treated with percocets with dr. Ronne Binning. Was provided with 15 tabs from this urgent care 09/26/2017 and then 20 tabs from a [place] provider 10/25/2017. Therefore he has not been filling entire months of narcotics for the past two months.     ROS per HPI.      Past Medical History:  Diagnosis Date  . Chronic pain     There are no active problems to display for this patient.   Past Surgical History:  Procedure Laterality Date  . LEG AMPUTATION BELOW KNEE    . TOE AMPUTATION         Home Medications    Prior to Admission medications   Medication Sig Start Date End Date Taking? Authorizing Provider  oxyCODONE-acetaminophen (PERCOCET) 10-325 MG tablet Take 1 tablet by mouth every 8 (eight) hours as needed for pain. 12/11/17   Georgetta Haber, NP    Family History No family history on file.  Social History Social History   Tobacco Use  . Smoking status: Current Every Day Smoker    Packs/day: 1.00    Types: Cigarettes  . Smokeless tobacco: Never Used  Substance Use Topics  . Alcohol use: No  . Drug use: No     Allergies   Ibuprofen and Naproxen   Review of Systems Review of Systems   Physical  Exam Triage Vital Signs ED Triage Vitals  Enc Vitals Group     BP 12/11/17 1206 127/79     Pulse Rate 12/11/17 1206 93     Resp --      Temp 12/11/17 1206 97.6 F (36.4 C)     Temp Source 12/11/17 1206 Oral     SpO2 12/11/17 1206 93 %     Weight --      Height --      Head Circumference --      Peak Flow --      Pain Score 12/11/17 1203 10     Pain Loc --      Pain Edu? --      Excl. in GC? --    No data found.  Updated Vital Signs BP 127/79 (BP Location: Left Arm)   Pulse 93   Temp 97.6 F (36.4 C) (Oral)   SpO2 93%   Visual Acuity  Physical Exam  Constitutional: He is oriented to person, place, and time. He appears well-developed and well-nourished.  Cardiovascular: Normal rate and regular rhythm.  Pulmonary/Chest: Effort normal and breath sounds normal.  Musculoskeletal:  Callous noted to stump of left BKA without redness or skin breakdown; callous' noted to right anterior toes as  well as to pad of foot; without redness or skin breakdown   Neurological: He is alert and oriented to person, place, and time.  Skin: Skin is warm and dry.     UC Treatments / Results  Labs (all labs ordered are listed, but only abnormal results are displayed) Labs Reviewed - No data to display  EKG None  Radiology No results found.  Procedures Procedures (including critical care time)  Medications Ordered in UC Medications - No data to display  Initial Impression / Assessment and Plan / UC Course  I have reviewed the triage vital signs and the nursing notes.  Pertinent labs & imaging results that were available during my care of the patient were reviewed by me and considered in my medical decision making (see chart for details).     Patient has been unable to establish with a PCP since Dr. Ronne Binning no longer in practice. Continuing to decrease opioid use, 10 tabs provided today. Discussed at length with patient that we can not provide any further narcotics for this  chronic pain and patient needs to continue to work to establish with a PCP. Patient verbalized understanding and agreeable to plan.    Final Clinical Impressions(s) / UC Diagnoses   Final diagnoses:  Stump pain [facility])     Discharge Instructions     Please establish with a primary care provider who can manage your pain long term for you. Continue to follow with podiatry for callous/corn management.    ED Prescriptions    Medication Sig Dispense Auth. Provider   oxyCODONE-acetaminophen (PERCOCET) 10-325 MG tablet Take 1 tablet by mouth every 8 (eight) hours as needed for pain. 10 tablet Georgetta Haber, NP     Controlled Substance Prescriptions [place] Controlled Substance Registry consulted? Not Applicable   Georgetta Haber, NP 12/11/17 1237

## 2017-12-11 NOTE — Discharge Instructions (Signed)
Please establish with a primary care provider who can manage your pain long term for you. Continue to follow with podiatry for callous/corn management.
